# Patient Record
Sex: Male | Born: 1995 | Race: Black or African American | Hispanic: No | Marital: Single | Smoking: Never smoker
Health system: Southern US, Community
[De-identification: ages and names within clinical notes are randomized; demographics above are authoritative.]

## PROBLEM LIST (undated history)

## (undated) DIAGNOSIS — J45909 Unspecified asthma, uncomplicated: Secondary | ICD-10-CM

---
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
## 2013-10-02 ENCOUNTER — Emergency Department (HOSPITAL_COMMUNITY)
Admission: EM | Admit: 2013-10-02 | Discharge: 2013-10-02 | Discharge status: Absent without leave | Payer: Self-pay | Source: Home / Self Care

## 2016-10-01 ENCOUNTER — Encounter (HOSPITAL_COMMUNITY): Payer: Self-pay

## 2016-10-01 ENCOUNTER — Emergency Department (HOSPITAL_COMMUNITY): Payer: Self-pay

## 2016-10-01 ENCOUNTER — Emergency Department (HOSPITAL_COMMUNITY)
Admission: EM | Admit: 2016-10-01 | Discharge: 2016-10-01 | Disposition: A | Discharge status: Home / Self Care | Payer: Self-pay | Attending: Emergency Medicine | Admitting: Emergency Medicine

## 2016-10-01 DIAGNOSIS — J4541 Moderate persistent asthma with (acute) exacerbation: Secondary | ICD-10-CM | POA: Insufficient documentation

## 2016-10-01 DIAGNOSIS — J069 Acute upper respiratory infection, unspecified: Secondary | ICD-10-CM

## 2016-10-01 LAB — BASIC METABOLIC PANEL
Anion gap: 9 (ref 5–15)
BUN: 12 mg/dL (ref 6–20)
CALCIUM: 9.6 mg/dL (ref 8.9–10.3)
CO2: 25 mmol/L (ref 22–32)
CREATININE: 1 mg/dL (ref 0.61–1.24)
Chloride: 104 mmol/L (ref 101–111)
GFR calc Af Amer: >60 mL/min (ref >60)
Glucose, Bld: 96 mg/dL (ref 65–99)
Potassium: 4.2 mmol/L (ref 3.5–5.1)
Sodium: 138 mmol/L (ref 135–145)

## 2016-10-01 LAB — CBC
HEMATOCRIT: 45.1 % (ref 39.0–52.0)
Hemoglobin: 15.9 g/dL (ref 13.0–17.0)
MCH: 30.5 pg (ref 26.0–34.0)
MCHC: 35.3 g/dL (ref 30.0–36.0)
MCV: 86.6 fL (ref 78.0–100.0)
Platelets: 233 10*3/uL (ref 150–400)
RBC: 5.21 MIL/uL (ref 4.22–5.81)
RDW: 13.3 % (ref 11.5–15.5)
WBC: 13.2 10*3/uL — ABNORMAL HIGH (ref 4.0–10.5)

## 2016-10-01 LAB — I-STAT TROPONIN, ED: Troponin i, poc: 0 ng/mL (ref 0.00–0.08)

## 2016-10-01 MED ORDER — PREDNISONE 10 MG PO TABS: 40.0000 mg | ORAL_TABLET | Freq: Every day | ORAL | 0 refills | Status: AC

## 2016-10-01 MED ORDER — ALBUTEROL SULFATE (2.5 MG/3ML) 0.083% IN NEBU
5.0000 mg | INHALATION_SOLUTION | Freq: Once | RESPIRATORY_TRACT | Status: AC
  Administered 2016-10-01: 5 mg via RESPIRATORY_TRACT

## 2016-10-01 MED ORDER — ALBUTEROL SULFATE HFA 108 (90 BASE) MCG/ACT IN AERS
2.0000 | INHALATION_SPRAY | Freq: Once | RESPIRATORY_TRACT | Status: AC
  Administered 2016-10-01: 2 via RESPIRATORY_TRACT
  Filled 2016-10-01: qty 6.7

## 2016-10-01 MED ORDER — PREDNISONE 20 MG PO TABS
40.0000 mg | ORAL_TABLET | Freq: Once | ORAL | Status: AC
  Administered 2016-10-01: 40 mg via ORAL
  Filled 2016-10-01: qty 2

## 2016-10-01 MED ORDER — ALBUTEROL SULFATE (2.5 MG/3ML) 0.083% IN NEBU
INHALATION_SOLUTION | RESPIRATORY_TRACT | Status: AC
  Filled 2016-10-01: qty 6

## 2016-10-01 NOTE — ED Triage Notes (Signed)
Pt states that for the past several days he has been having chest tightness, today it got worse and has syncopal episode and woke up with dyspnea. Pt unsure if he hit his head but c/o of a headache.

## 2016-10-01 NOTE — ED Provider Notes (Signed)
[facility]-EMERGENCY DEPT Provider Note   CSN: [id number] Arrival date & time: 10/01/16  0447     History   Chief Complaint Chief Complaint  Patient presents with  . Shortness of Breath  . Chest Pain    HPI Kyle Ramirez is a 21 y.o. male.  The history is provided by the patient.  Shortness of Breath  This is a recurrent problem. The average episode lasts 2 days. The problem occurs continuously.The current episode started 2 days ago. The problem has been gradually worsening. Associated symptoms include coryza, rhinorrhea, cough and wheezing. Pertinent negatives include no fever, no sputum production, no vomiting, no abdominal pain and no leg swelling. Treatments tried: forgot his inhalers in college. The treatment provided no relief. Associated medical issues include asthma.   Pt received albuterol neb in triage prior to my evaluation and states he had significant improvement in his symptoms following the treatment.  Past Medical History:  Diagnosis Date  . Asthma     There are no active problems to display for this patient.   History reviewed. No pertinent surgical history.     Home Medications    Prior to Admission medications   Medication Sig Start Date End Date Taking? Authorizing Provider  albuterol (PROVENTIL HFA;VENTOLIN HFA) 108 (90 BASE) MCG/ACT inhaler Inhale 2 puffs into the lungs every 6 (six) hours as needed for wheezing or shortness of breath.    Yes Historical Provider, MD  cetirizine (ZYRTEC) 10 MG tablet Take 10 mg by mouth daily as needed for allergies.    Yes Historical Provider, MD  guaiFENesin (MUCINEX) 600 MG 12 hr tablet Take 600 mg by mouth 2 (two) times daily as needed for cough or to loosen phlegm.   Yes Historical Provider, MD  Pediatric Multiple Vit-C-FA (CHILDS CHEW MULTI-VITAMIN PO) Take 1 tablet by mouth daily.   Yes Historical Provider, MD  predniSONE (DELTASONE) 10 MG tablet Take 4 tablets (40 mg total) by mouth daily. 10/02/16 10/06/16  Nira Conn, MD    Family History No family history on file.  Social History Social History  Substance Use Topics  . Smoking status: Never Smoker  . Smokeless tobacco: Never Used  . Alcohol use No     Allergies   Patient has no known allergies.   Review of Systems Review of Systems  Constitutional: Negative for fever.  HENT: Positive for rhinorrhea.   Respiratory: Positive for cough, chest tightness, shortness of breath and wheezing. Negative for sputum production.   Cardiovascular: Negative for leg swelling.  Gastrointestinal: Negative for abdominal pain and vomiting.  Ten systems are reviewed and are negative for acute change except as noted in the HPI    Physical Exam Updated Vital Signs BP 141/98   Pulse 100   Temp 98.7 F (37.1 C) (Oral)   Resp 20   SpO2 99%   Physical Exam  Constitutional: He is oriented to person, place, and time. He appears well-developed and well-nourished. No distress.  HENT:  Head: Normocephalic and atraumatic.  Nose: Nose normal.  Eyes: Conjunctivae and EOM are normal. Pupils are equal, round, and reactive to light. Right eye exhibits no discharge. Left eye exhibits no discharge. No scleral icterus.  Neck: Normal range of motion. Neck supple.  Cardiovascular: Normal rate and regular rhythm.  Exam reveals no gallop and no friction rub.   No murmur heard. Pulmonary/Chest: Effort normal and breath sounds normal. No stridor. No respiratory distress. He has no rales.  Abdominal: Soft. He exhibits no  distension. There is no tenderness.  Musculoskeletal: He exhibits no edema or tenderness.  Neurological: He is alert and oriented to person, place, and time.  Skin: Skin is warm and dry. No rash noted. He is not diaphoretic. No erythema.  Psychiatric: He has a normal mood and affect.  Vitals reviewed.    ED Treatments / Results  Labs (all labs ordered are listed, but only abnormal results are displayed) Labs Reviewed  CBC -  Abnormal; Notable for the following:       Result Value   WBC 13.2 (*)    All other components within normal limits  BASIC METABOLIC PANEL  I-STAT TROPOININ, ED    EKG  EKG Interpretation  Date/Time:  Saturday October 01 2016 04:59:27 EST Ventricular Rate:  110 PR Interval:  126 QRS Duration: 80 QT Interval:  294 QTC Calculation: 397 R Axis:   90 Text Interpretation:  Sinus tachycardia Rightward axis Borderline ECG No old tracing to compare Confirmed by [facility] MD, Dreshawn Hendershott ([id number]) on 10/01/2016 6:59:32 AM       Radiology Dg Chest 2 View  Result Date: 10/01/2016 CLINICAL DATA:  Chest pain and cough for 2 days EXAM: CHEST  2 VIEW COMPARISON:  None. FINDINGS: The heart size and mediastinal contours are within normal limits. Both lungs are clear. The visualized skeletal structures are unremarkable. IMPRESSION: No active cardiopulmonary disease. Electronically Signed   By: Ellery Plunkaniel R Mitchell M.D.   On: 10/01/2016 06:58    Procedures Procedures (including critical care time)  Medications Ordered in ED Medications  predniSONE (DELTASONE) tablet 40 mg (not administered)  albuterol (PROVENTIL HFA;VENTOLIN HFA) 108 (90 Base) MCG/ACT inhaler 2 puff (not administered)  albuterol (PROVENTIL) (2.5 MG/3ML) 0.083% nebulizer solution 5 mg (5 mg Nebulization Given 10/01/16 0512)     Initial Impression / Assessment and Plan / ED Course  I have reviewed the triage vital signs and the nursing notes.  Pertinent labs & imaging results that were available during my care of the patient were reviewed by me and considered in my medical decision making (see chart for details).  Clinical Course     Consistent with asthma exacerbation in the setting of URI sx. No fever here. CXR w/o PNA. Given PO steroids and provided him with inhaler. It has been approx 4 hours since initial triage treatment w/o recurrence of symptoms and no wheezing on exam. The patient is safe for discharge with strict return  precautions.   Final Clinical Impressions(s) / ED Diagnoses   Final diagnoses:  Moderate persistent asthma with exacerbation  Upper respiratory tract infection, unspecified type   Disposition: Discharge  Condition: Good  I have discussed the results, Dx and Tx plan with the patient who expressed understanding and agree(s) with the plan. Discharge instructions discussed at great length. The patient was given strict return precautions who verbalized understanding of the instructions. No further questions at time of discharge.    New Prescriptions   PREDNISONE (DELTASONE) 10 MG TABLET    Take 4 tablets (40 mg total) by mouth daily.    Follow Up: Melanie CrazierMinda Kramer, NP [address] [place] [phone]  Schedule an appointment as soon as possible for a visit  As needed      Nira ConnPedro Eduardo Arlet Marter, MD 10/01/16 [phone]

## 2018-01-15 IMAGING — CR DG CHEST 2V
2 series · 2 of 2 positions shown · non-contrast
Comparison: None.

CLINICAL DATA: Chest pain and cough for 2 days

EXAM:
CHEST  2 VIEW

[chest lat]
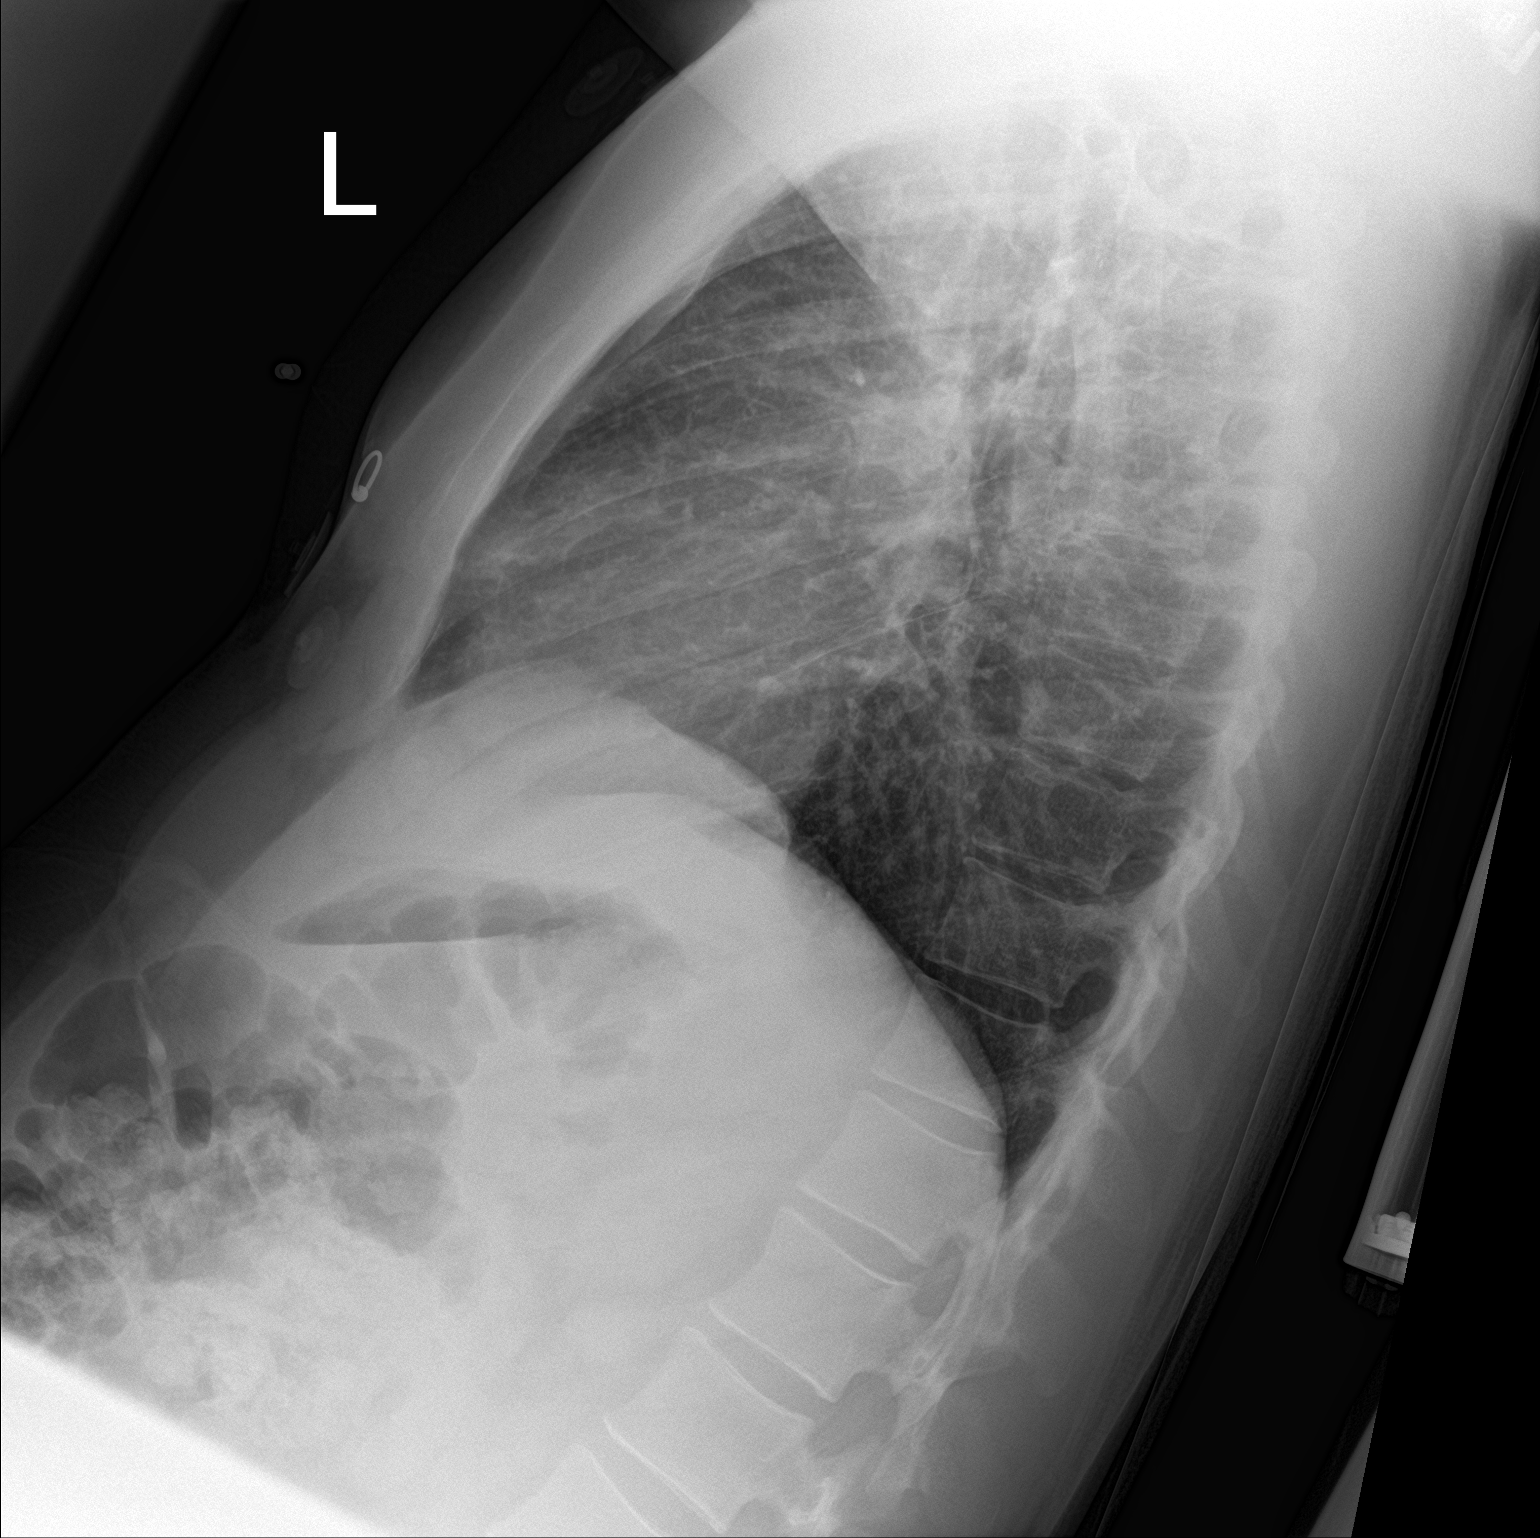

[chest ap]
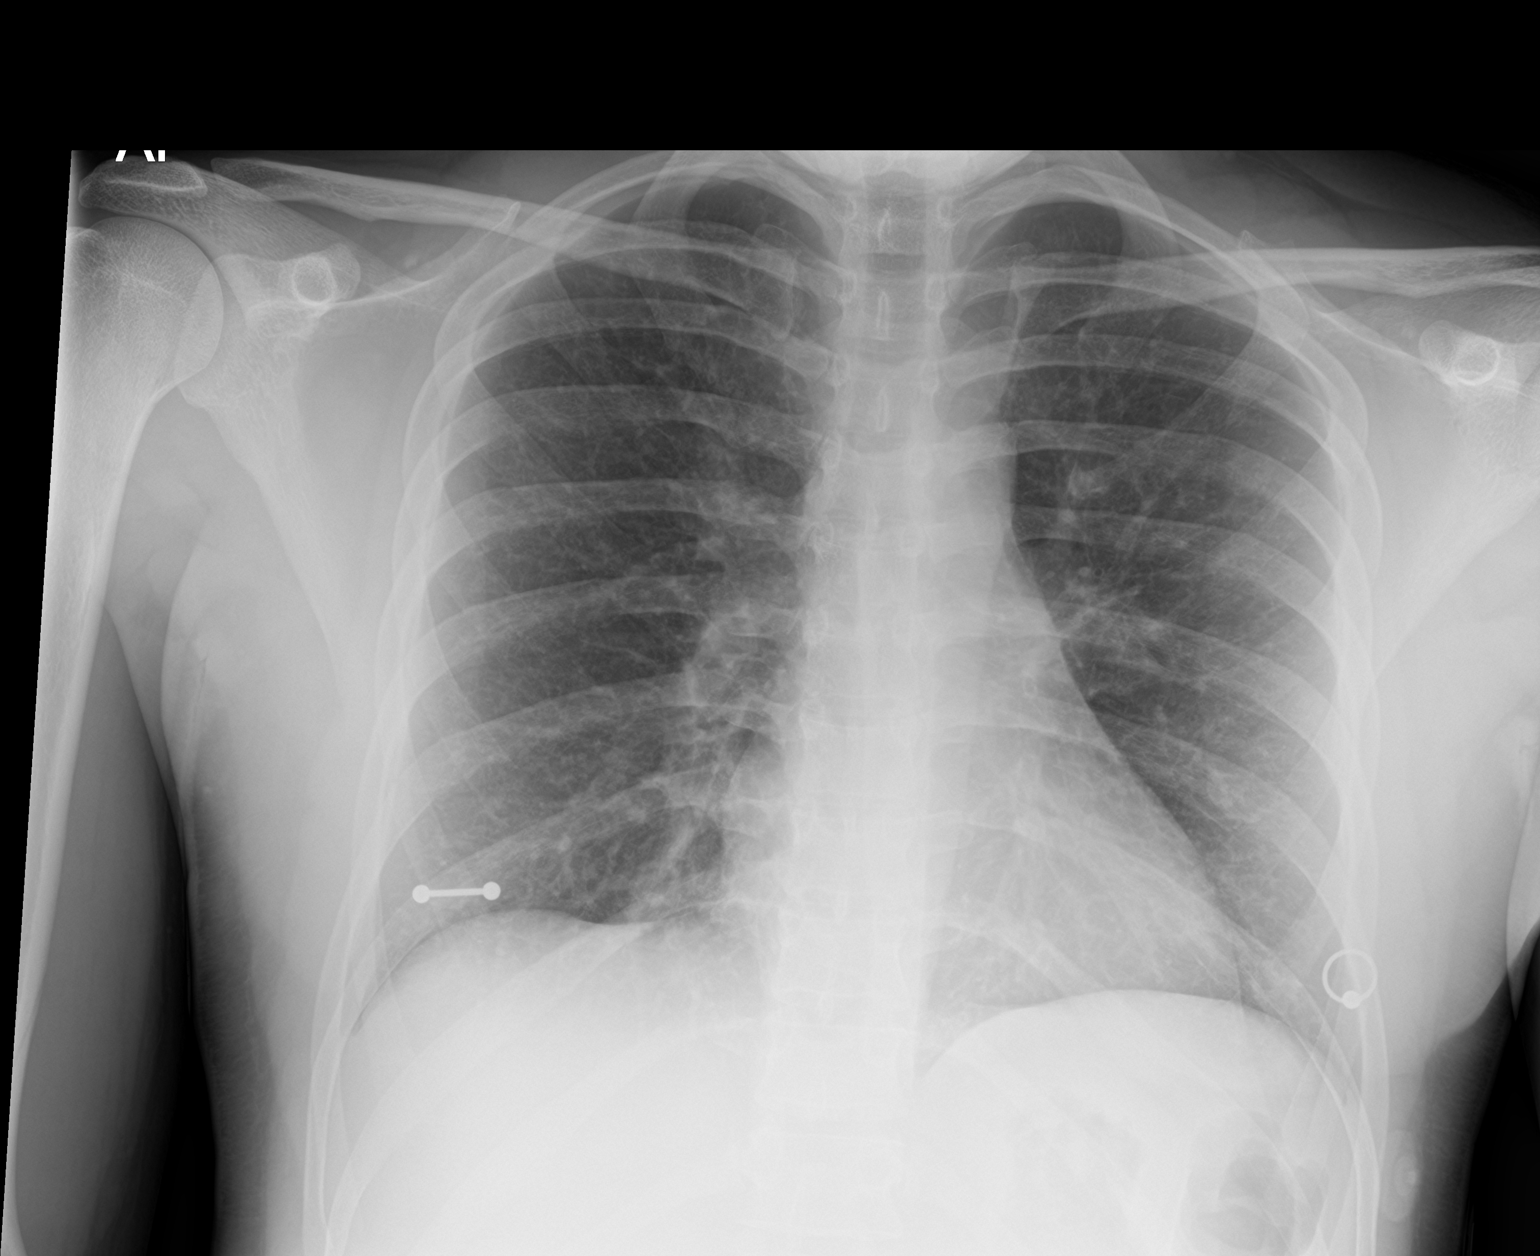

[2 of 2 positions shown; findings below may reference images not displayed]

FINDINGS: The heart size and mediastinal contours are within normal limits.
Both lungs are clear. The visualized skeletal structures are
unremarkable.
IMPRESSION: No active cardiopulmonary disease.
